# Patient Record
Sex: Male | Born: 1945 | Race: White | Hispanic: No | Marital: Married | Smoking: Never smoker
Health system: Southern US, Community
[De-identification: ages and names within clinical notes are randomized; demographics above are authoritative.]

## PROBLEM LIST (undated history)

## (undated) DIAGNOSIS — E786 Lipoprotein deficiency: Secondary | ICD-10-CM

## (undated) DIAGNOSIS — R002 Palpitations: Secondary | ICD-10-CM

## (undated) DIAGNOSIS — F419 Anxiety disorder, unspecified: Secondary | ICD-10-CM

## (undated) DIAGNOSIS — G43909 Migraine, unspecified, not intractable, without status migrainosus: Secondary | ICD-10-CM

## (undated) DIAGNOSIS — C61 Malignant neoplasm of prostate: Secondary | ICD-10-CM

## (undated) DIAGNOSIS — C4491 Basal cell carcinoma of skin, unspecified: Secondary | ICD-10-CM

## (undated) DIAGNOSIS — K635 Polyp of colon: Secondary | ICD-10-CM

## (undated) DIAGNOSIS — K219 Gastro-esophageal reflux disease without esophagitis: Secondary | ICD-10-CM

## (undated) DIAGNOSIS — H409 Unspecified glaucoma: Secondary | ICD-10-CM

## (undated) HISTORY — DX: Polyp of colon: K63.5

## (undated) HISTORY — DX: Lipoprotein deficiency: E78.6

## (undated) HISTORY — DX: Malignant neoplasm of prostate: C61

## (undated) HISTORY — DX: Basal cell carcinoma of skin, unspecified: C44.91

## (undated) HISTORY — DX: Gastro-esophageal reflux disease without esophagitis: K21.9

## (undated) HISTORY — PX: SHOULDER SURGERY: SHX246

## (undated) HISTORY — DX: Unspecified glaucoma: H40.9

## (undated) HISTORY — PX: PROSTATECTOMY: SHX69

## (undated) HISTORY — PX: KNEE SURGERY: SHX244

## (undated) HISTORY — DX: Anxiety disorder, unspecified: F41.9

## (undated) HISTORY — DX: Migraine, unspecified, not intractable, without status migrainosus: G43.909

## (undated) HISTORY — DX: Palpitations: R00.2

---

## 2009-02-16 ENCOUNTER — Inpatient Hospital Stay (HOSPITAL_COMMUNITY): Admission: RE | Admit: 2009-02-16 | Discharge: 2009-02-17 | Payer: Self-pay | Admitting: Urology

## 2009-02-16 ENCOUNTER — Encounter (INDEPENDENT_AMBULATORY_CARE_PROVIDER_SITE_OTHER): Payer: Self-pay | Admitting: Urology

## 2010-06-08 LAB — BASIC METABOLIC PANEL
CO2: 31 mEq/L (ref 19–32)
Calcium: 9.5 mg/dL (ref 8.4–10.5)
Creatinine, Ser: 0.99 mg/dL (ref 0.4–1.5)
GFR calc Af Amer: >60 mL/min (ref >60)
Glucose, Bld: 94 mg/dL (ref 70–99)

## 2010-06-08 LAB — CBC
MCHC: 33.5 g/dL (ref 30.0–36.0)
Platelets: 298 10*3/uL (ref 150–400)
RBC: 5.13 MIL/uL (ref 4.22–5.81)
RDW: 13 % (ref 11.5–15.5)

## 2010-06-08 LAB — HEMOGLOBIN AND HEMATOCRIT, BLOOD: Hemoglobin: 11.5 g/dL — ABNORMAL LOW (ref 13.0–17.0)

## 2010-06-08 LAB — TYPE AND SCREEN
ABO/RH(D): O POS
Antibody Screen: NEGATIVE

## 2011-11-30 ENCOUNTER — Other Ambulatory Visit: Payer: Self-pay | Admitting: Family Medicine

## 2011-11-30 DIAGNOSIS — R109 Unspecified abdominal pain: Secondary | ICD-10-CM

## 2011-12-05 ENCOUNTER — Ambulatory Visit
Admission: RE | Admit: 2011-12-05 | Discharge: 2011-12-05 | Disposition: A | Discharge status: Home / Self Care | Payer: Managed Care, Other (non HMO) | Source: Ambulatory Visit | Attending: Family Medicine | Admitting: Family Medicine

## 2011-12-05 DIAGNOSIS — R109 Unspecified abdominal pain: Secondary | ICD-10-CM

## 2011-12-05 MED ORDER — IOHEXOL 300 MG/ML  SOLN
100.0000 mL | Freq: Once | INTRAMUSCULAR | Status: AC | PRN
  Administered 2011-12-05: 100 mL via INTRAVENOUS

## 2013-09-22 IMAGING — CT CT ABD-PELV W/ CM
2 of 5 series · 17 of 46 positions shown, 19 images · IV contrast (READICAT/WATER & [ID] OMNI 300)
Comparison: None

CLINICAL DATA: 65 year old male with abdominal and pelvic pain.
History of prostate cancer.

CT ABDOMEN AND PELVIS WITH CONTRAST
TECHNIQUE: Multidetector CT imaging of the abdomen and pelvis was
performed following the standard protocol during bolus
administration of intravenous contrast.
Contrast: 100mL OMNIPAQUE IOHEXOL 300 MG/ML  SOLN

[Series 2: abd/pelvis with · axial · 0.77mm/px · z∈[-410,+45]mm · 14 of 99 slices shown, 16 images]
[im 5/99  soft-tissue]
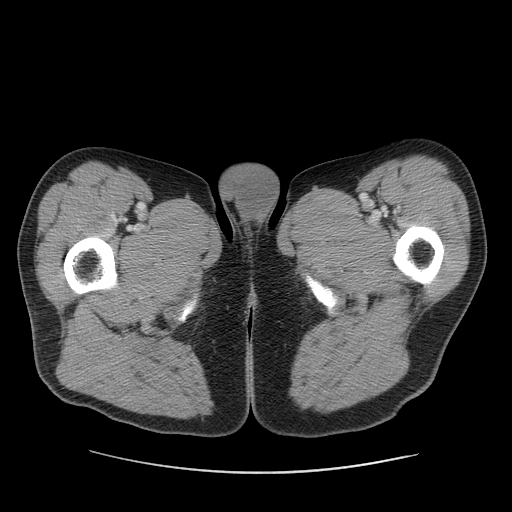
[im 5/99  bone]
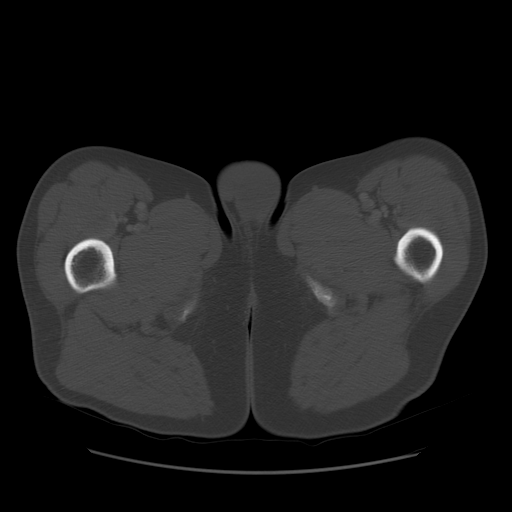
[im 15/99  soft-tissue]
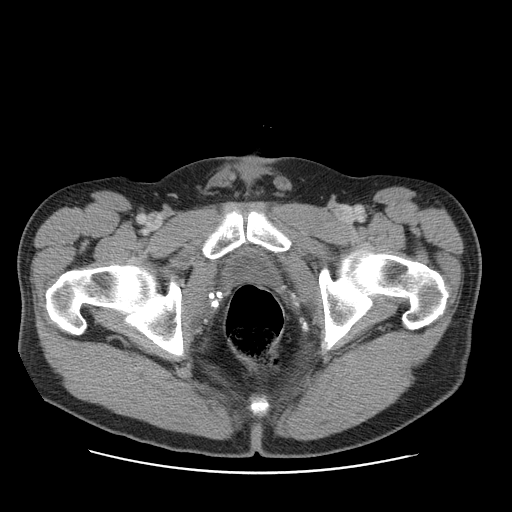
[im 20/99  soft-tissue]
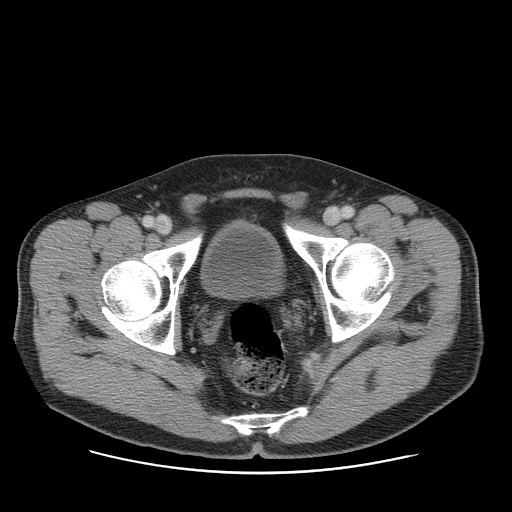
[im 25/99  soft-tissue]
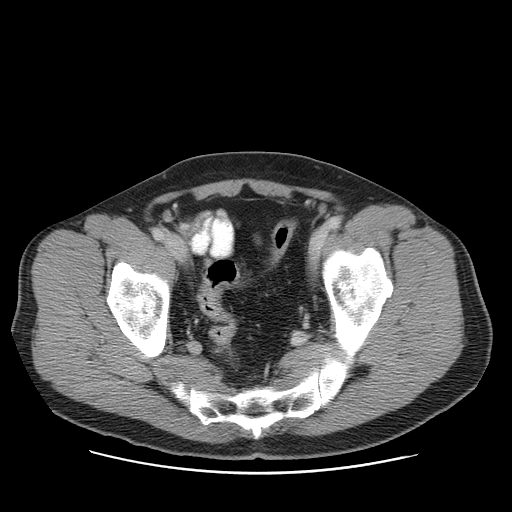
[im 35/99  soft-tissue]
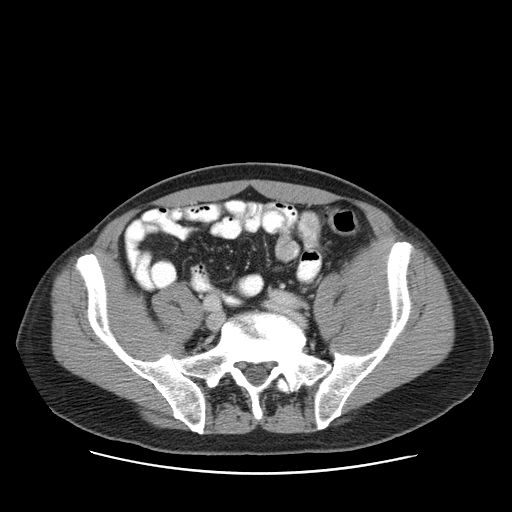
[im 40/99  soft-tissue]
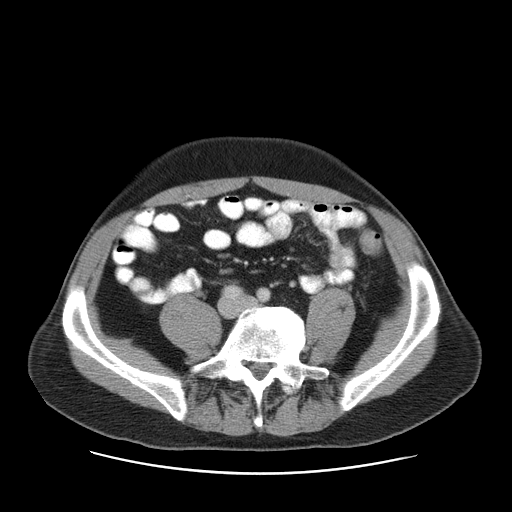
[im 45/99  soft-tissue]
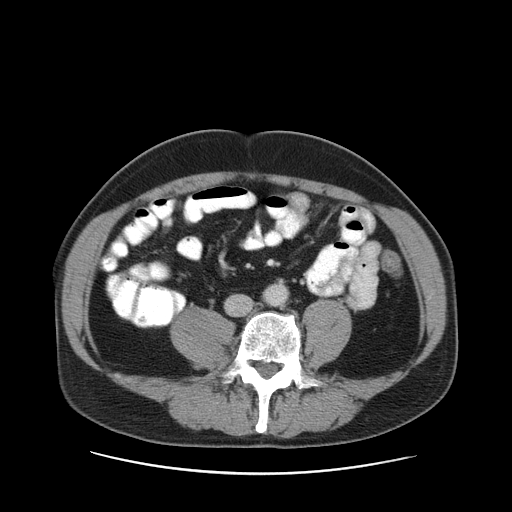
[im 54/99  soft-tissue]
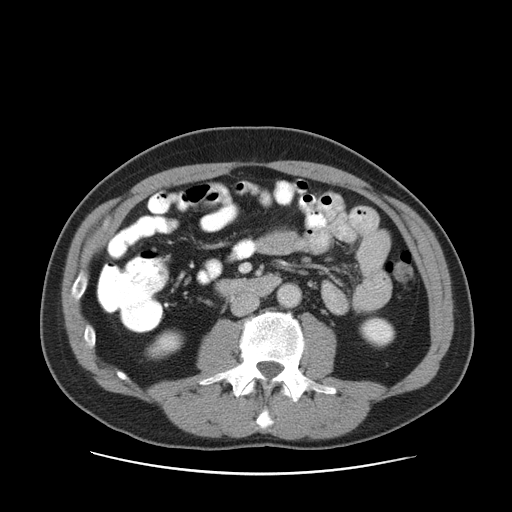
[im 59/99  soft-tissue]
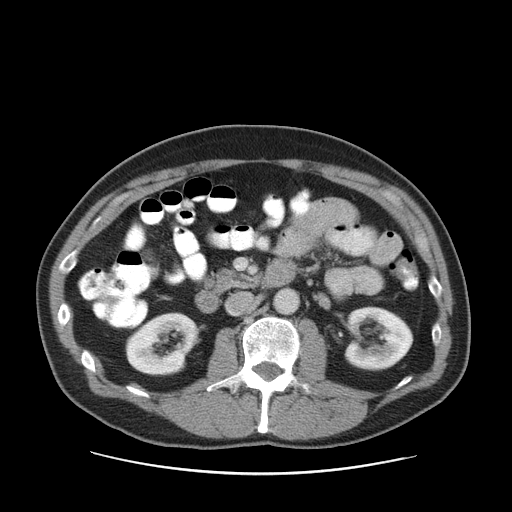
[im 59/99  bone]
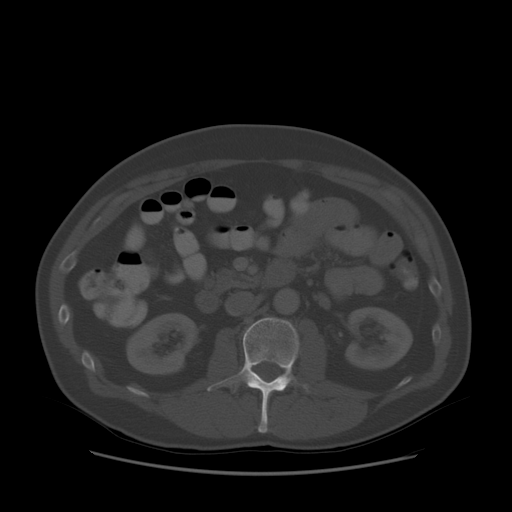
[im 64/99  soft-tissue]
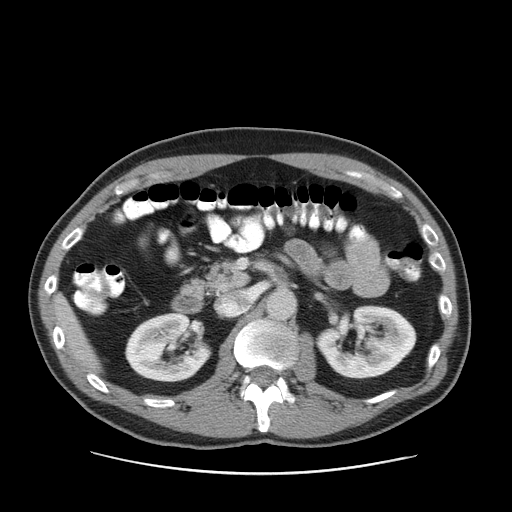
[im 74/99  soft-tissue]
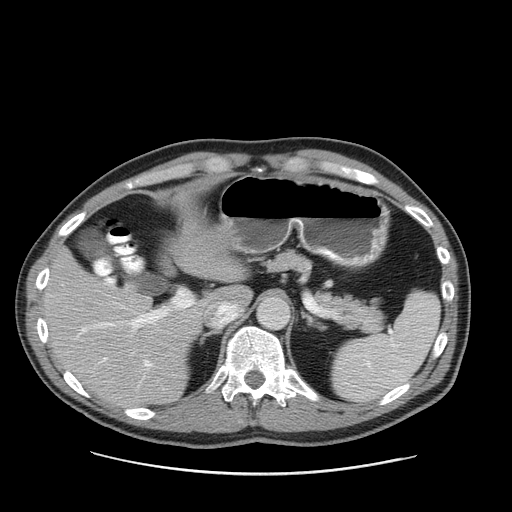
[im 79/99  soft-tissue]
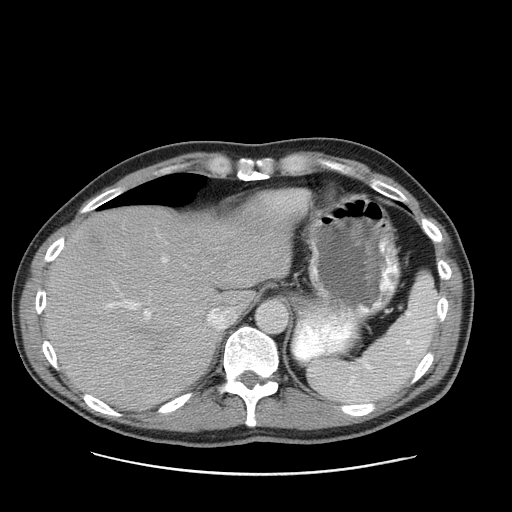
[im 84/99  soft-tissue]
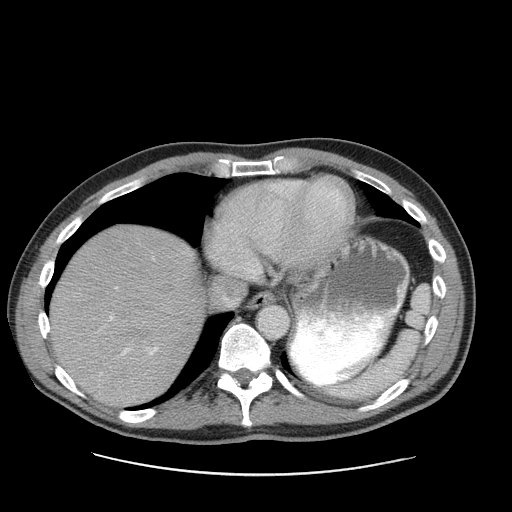
[im 94/99  soft-tissue]
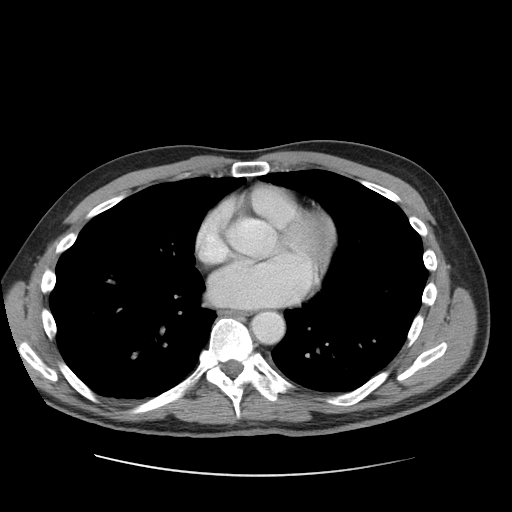

[Series 400: coronals · coronal · 1.03mm/px · 3 of 115 slices shown]
[im 39/115  soft-tissue]
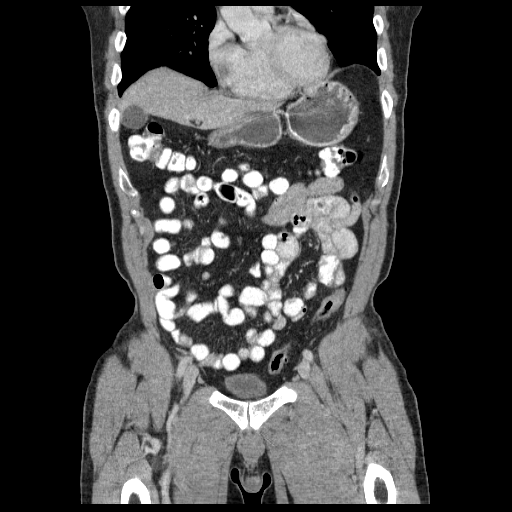
[im 51/115  soft-tissue]
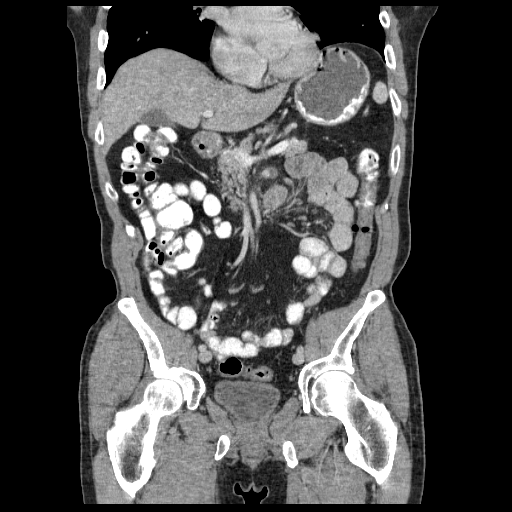
[im 64/115  soft-tissue]
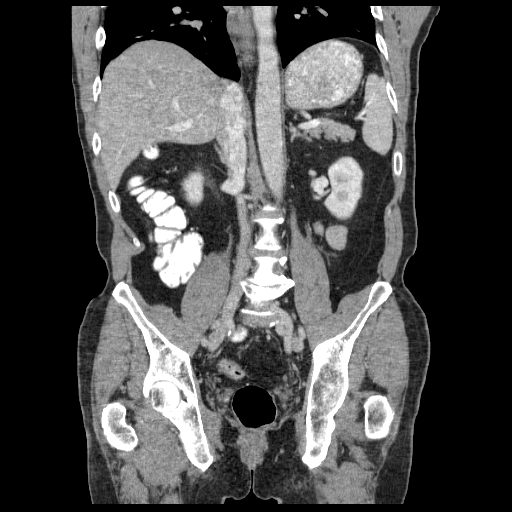

[17 of 46 positions shown; findings below may reference images not displayed]

FINDINGS: The liver, spleen, pancreas, adrenal glands, kidneys and
gallbladder unremarkable.

No free fluid, enlarged lymph nodes, biliary dilation or abdominal
aortic aneurysm identified.

The bowel and bladder are unremarkable.

The patient is status post prostatectomy.
There is no evidence of discrete mass or enlarged lymph nodes
within the pelvis.

No acute or suspicious bony abnormalities are identified.
A moderate apex left lumbar scoliosis is again identified with
moderate to severe degenerative disc disease at L4-L5 and L5-S1.
IMPRESSION: No evidence of acute abnormality or metastatic disease.

## 2015-10-15 ENCOUNTER — Telehealth: Payer: Self-pay | Admitting: Internal Medicine

## 2015-10-15 NOTE — Telephone Encounter (Signed)
Received records from [place] for appointment on 11/03/15 with Dr Debara Pickett.  Records given to [place] (medical records) for Dr Lysbeth Penner schedule on 11/03/15. lp

## 2015-11-03 ENCOUNTER — Ambulatory Visit: Payer: Managed Care, Other (non HMO) | Admitting: Internal Medicine

## 2015-12-07 ENCOUNTER — Ambulatory Visit: Payer: Managed Care, Other (non HMO) | Admitting: Internal Medicine

## 2015-12-11 ENCOUNTER — Encounter (INDEPENDENT_AMBULATORY_CARE_PROVIDER_SITE_OTHER): Payer: Managed Care, Other (non HMO)

## 2015-12-11 ENCOUNTER — Encounter: Payer: Self-pay | Admitting: Internal Medicine

## 2015-12-11 ENCOUNTER — Ambulatory Visit (INDEPENDENT_AMBULATORY_CARE_PROVIDER_SITE_OTHER): Payer: Managed Care, Other (non HMO) | Admitting: Internal Medicine

## 2015-12-11 VITALS — BP 142/85 | HR 59 | Ht 70.0 in | Wt 165.4 lb

## 2015-12-11 DIAGNOSIS — R002 Palpitations: Secondary | ICD-10-CM | POA: Diagnosis not present

## 2015-12-11 NOTE — Patient Instructions (Signed)
Medication Instructions:  Continue current medications  Labwork: None Ordered  Testing/Procedures: Your physician has recommended that you wear an event monitor for 30 days. Event monitors are medical devices that record the heart's electrical activity. Doctors most often [place] these monitors to diagnose arrhythmias. Arrhythmias are problems with the speed or rhythm of the heartbeat. The monitor is a small, portable device. You can wear one while you do your normal daily activities. This is usually used to diagnose what is causing palpitations/syncope (passing out).  Follow-Up: Your physician recommends that you schedule a follow-up appointment in: 6-8 weeks   Any Other Special Instructions Will Be Listed Below (If Applicable).   If you need a refill on your cardiac medications before your next appointment, please call your pharmacy.

## 2015-12-11 NOTE — Progress Notes (Signed)
OFFICE NOTE  Chief Complaint:  Heart fluttering  Primary Care Physician: Vernon Pac, MD  HPI:  Vernon Lawson is a 70 y.o. male he is currently referred to me by Dr. Rex Lawson for evaluation of heart fluttering. Vernon Lawson reports she's had this on and off for "longer than I can remember". Recently he had an episode of sustained palpitations mostly at night. He had difficulty sleeping. Since then he's only had 2 other episodes which were short-lived. He reports a small amount of caffeine use with mostly sodas, consisting of 1 per day several days a week. He also regularly uses an aspirin/caffeine/acetaminophen (Excedrin) for headaches which she gets regularly in the morning. He reports good sleep and energy during the day and has been told he snores lightly but denies any witnessed apnea. He also denies any chest pain or worsening shortness of breath. He is retired but maintains some physical activity. There is no significant coronary artery disease history in his family. Interestingly, his son is a PA who works in cardiology with [employer].  PMHx:  Past Medical History:  Diagnosis Date  . Anxiety   . Basal cell carcinoma   . Colon polyps   . GERD (gastroesophageal reflux disease)   . Glaucoma   . Low HDL (under 40)   . Migraines   . Palpitations   . Prostate cancer [facility])     Past Surgical History:  Procedure Laterality Date  . KNEE SURGERY Left   . PROSTATECTOMY    . SHOULDER SURGERY      FAMHx:  Family History  Problem Relation Age of Onset  . Breast cancer Mother   . Aneurysm Father     SOCHx:   reports that he has never smoked. He does not have any smokeless tobacco history on file. His alcohol and drug histories are not on file.  ALLERGIES:  No Known Allergies  ROS: Pertinent items noted in HPI and remainder of comprehensive ROS otherwise negative.  HOME MEDS: Current Outpatient Prescriptions on File Prior to Visit  Medication Sig Dispense Refill  .  aspirin-acetaminophen-caffeine (EXCEDRIN MIGRAINE) 250-250-65 MG tablet Take 1 tablet by mouth every 6 (six) hours as needed for headache.    . clonazePAM (KLONOPIN) 1 MG tablet Take 1 mg by mouth daily.    . famotidine (PEPCID) 10 MG tablet Take 10 mg by mouth 2 (two) times daily.    Marland Kitchen latanoprost (XALATAN) 0.005 % ophthalmic solution 1 drop at bedtime.    . Multiple Vitamin (MULTIVITAMIN WITH MINERALS) TABS tablet Take 1 tablet by mouth daily.    . rizatriptan (MAXALT) 10 MG tablet Take 10 mg by mouth as needed for migraine. May repeat in 2 hours if needed    . timolol (BETIMOL) 0.25 % ophthalmic solution 1-2 drops daily.     No current facility-administered medications on file prior to visit.     LABS/IMAGING: No results found for this or any previous visit (from the past 48 hour(s)). No results found.  WEIGHTS: Wt Readings from Last 3 Encounters:  12/11/15 165 lb 6.4 oz (75 kg)    VITALS: BP (!) 142/85   Pulse (!) 59   Ht 5\' 10"  (1.778 m)   Wt 165 lb 6.4 oz (75 kg)   BMI 23.73 kg/m   EXAM: General appearance: alert and no distress Neck: no carotid bruit and no JVD Lungs: clear to auscultation bilaterally Heart: regular rate and rhythm, S1, S2 normal, no murmur, click, rub or gallop Abdomen: soft,  non-tender; bowel sounds normal; no masses,  no organomegaly Extremities: extremities normal, atraumatic, no cyanosis or edema Pulses: 2+ and symmetric Skin: Skin color, texture, turgor normal. No rashes or lesions Neurologic: Grossly normal Psych: Pleasant  EKG: Sinus bradycardia 59  ASSESSMENT: 1. Palpitations  PLAN: 1.   Vernon Lawson is experiencing palpitations. This has been present for "longer than he can remember". Recently, there has been an increase in severity and duration, but then it has waned. I recommended a 30 day monitor to see if we can pick up on the cause of his palpitations. I suspect these are benign. He does not report any warning symptoms such as chest  pain or worsening shortness of breath. His cardiovascular exam was benign. EKG at rest is normal. Although he has morning headaches, I'm not overwhelmingly convinced this is related to sleep apnea although that could be a cause of palpitations. He could also have NSAID related rebound headaches with frequent use of Excedrin. I will defer to his PCP on this.  Follow-up with me in 4-6 weeks after monitoring.  Thanks as always for the kind referral.  Vernon Casino, MD, [facility] Attending Cardiologist [place] 12/11/2015, 12:37 PM

## 2016-01-04 ENCOUNTER — Telehealth: Payer: Self-pay | Admitting: Internal Medicine

## 2016-01-04 NOTE — Telephone Encounter (Signed)
New message      Pt is wearing an event monitor.  He had it put on 12-11-15.  He states he will be out of town in the [place] when it is time to take it off.  Can he take monitor off within the next day or two and send it back to the company?  [place]  Please call

## 2016-01-04 NOTE — Telephone Encounter (Signed)
Returned call. Pt aware preference is to wear for full prescribed time, but he notes this will be 27-28 days of wear by the day he leaves. Advised would be OK to return, it's really his choice. Recommended use of [employer] drop station for return of package and reminded him of results follow up time. Pt voiced thanks for call.

## 2016-01-10 DIAGNOSIS — R002 Palpitations: Secondary | ICD-10-CM | POA: Diagnosis not present

## 2016-01-27 ENCOUNTER — Encounter: Payer: Self-pay | Admitting: *Deleted

## 2016-02-01 ENCOUNTER — Telehealth: Payer: Self-pay | Admitting: Internal Medicine

## 2016-02-01 NOTE — Telephone Encounter (Signed)
Closed encounter °

## 2016-02-02 ENCOUNTER — Ambulatory Visit: Payer: Managed Care, Other (non HMO) | Admitting: Internal Medicine

## 2016-02-18 ENCOUNTER — Encounter: Payer: Self-pay | Admitting: Internal Medicine

## 2016-02-18 ENCOUNTER — Ambulatory Visit (INDEPENDENT_AMBULATORY_CARE_PROVIDER_SITE_OTHER): Payer: Managed Care, Other (non HMO) | Admitting: Internal Medicine

## 2016-02-18 VITALS — BP 128/80 | HR 74 | Ht 70.0 in | Wt 171.4 lb

## 2016-02-18 DIAGNOSIS — R002 Palpitations: Secondary | ICD-10-CM

## 2016-02-18 NOTE — Patient Instructions (Signed)
Your physician recommends that you schedule a follow-up appointment as needed  

## 2016-02-18 NOTE — Progress Notes (Signed)
OFFICE NOTE  Chief Complaint:  Heart fluttering  Primary Care Physician: Gennette Pac, MD  HPI:  Vernon Lawson is a 70 y.o. male he is currently referred to me by Dr. Rex Kras for evaluation of heart fluttering. Vernon Lawson reports she's had this on and off for "longer than I can remember". Recently he had an episode of sustained palpitations mostly at night. He had difficulty sleeping. Since then he's only had 2 other episodes which were short-lived. He reports a small amount of caffeine use with mostly sodas, consisting of 1 per day several days a week. He also regularly uses an aspirin/caffeine/acetaminophen (Excedrin) for headaches which she gets regularly in the morning. He reports good sleep and energy during the day and has been told he snores lightly but denies any witnessed apnea. He also denies any chest pain or worsening shortness of breath. He is retired but maintains some physical activity. There is no significant coronary artery disease history in his family. Interestingly, his son is a PA who works in cardiology with [employer].  02/18/2016  Vernon Lawson was seen today in follow-up. He wore monitor for one month for palpitations. He reports he did have some palpitations while wearing the monitor, but we failed to pick up on any abnormalities. He did have some. To sinus tachycardia and bradycardia (mostly at night). He reports she's not very symptomatic with these episodes. He continues to have headaches which may be associated with these episodes.  PMHx:  Past Medical History:  Diagnosis Date  . Anxiety   . Basal cell carcinoma   . Colon polyps   . GERD (gastroesophageal reflux disease)   . Glaucoma   . Low HDL (under 40)   . Migraines   . Palpitations   . Prostate cancer [facility])     Past Surgical History:  Procedure Laterality Date  . KNEE SURGERY Left   . PROSTATECTOMY    . SHOULDER SURGERY      FAMHx:  Family History  Problem Relation Age of Onset  . Breast cancer  Mother   . Aneurysm Father     SOCHx:   reports that he has never smoked. He has never used smokeless tobacco. His alcohol and drug histories are not on file.  ALLERGIES:  No Known Allergies  ROS: Pertinent items noted in HPI and remainder of comprehensive ROS otherwise negative.  HOME MEDS: Current Outpatient Prescriptions on File Prior to Visit  Medication Sig Dispense Refill  . aspirin-acetaminophen-caffeine (EXCEDRIN MIGRAINE) 250-250-65 MG tablet Take 1 tablet by mouth every 6 (six) hours as needed for headache.    . clonazePAM (KLONOPIN) 1 MG tablet Take 1 mg by mouth daily.    . famotidine (PEPCID) 10 MG tablet Take 10 mg by mouth 2 (two) times daily.    Marland Kitchen latanoprost (XALATAN) 0.005 % ophthalmic solution 1 drop at bedtime.    . Multiple Vitamin (MULTIVITAMIN WITH MINERALS) TABS tablet Take 1 tablet by mouth daily.    . rizatriptan (MAXALT) 10 MG tablet Take 10 mg by mouth as needed for migraine. May repeat in 2 hours if needed    . timolol (BETIMOL) 0.25 % ophthalmic solution 1-2 drops daily.     No current facility-administered medications on file prior to visit.     LABS/IMAGING: No results found for this or any previous visit (from the past 48 hour(s)). No results found.  WEIGHTS: Wt Readings from Last 3 Encounters:  12/11/15 165 lb 6.4 oz (75 kg)  VITALS: There were no vitals taken for this visit.  EXAM: Deferred  EKG: Deferred  ASSESSMENT: 1. Palpitations  PLAN: 1.   Mr. [name] wore a 30 day monitor which showed sinus bradycardia, mostly nocturnal with low heart rate of 43 and some periods of sinus tachycardia. No significant arrhythmias were noted. There was no evidence of any extrasystoles. He continues to have headaches. I wonder if he may be having some fasciculations in the chest wall which may be interpreted as palpitations. He does not seem overly bothered by this. I recommended continuing monitoring for increased frequency of his symptoms and  would be happy to see him back if they become worse or he is interested in treatment. I advised he may consider seen headache specialist for treatment of his headaches.  Follow-up when necessary.  Pixie Casino, MD, [facility] Attending Cardiologist [place] Makaiyah Schweiger 02/18/2016, 8:26 AM

## 2020-04-29 DIAGNOSIS — K219 Gastro-esophageal reflux disease without esophagitis: Secondary | ICD-10-CM | POA: Diagnosis not present

## 2020-04-29 DIAGNOSIS — E782 Mixed hyperlipidemia: Secondary | ICD-10-CM | POA: Diagnosis not present

## 2020-04-29 DIAGNOSIS — H4089 Other specified glaucoma: Secondary | ICD-10-CM | POA: Diagnosis not present

## 2020-04-29 DIAGNOSIS — E7800 Pure hypercholesterolemia, unspecified: Secondary | ICD-10-CM | POA: Diagnosis not present

## 2020-05-26 DIAGNOSIS — H401132 Primary open-angle glaucoma, bilateral, moderate stage: Secondary | ICD-10-CM | POA: Diagnosis not present

## 2022-07-26 ENCOUNTER — Other Ambulatory Visit (HOSPITAL_COMMUNITY): Payer: Self-pay | Admitting: Family Medicine

## 2022-07-26 DIAGNOSIS — E7800 Pure hypercholesterolemia, unspecified: Secondary | ICD-10-CM

## 2022-07-26 DIAGNOSIS — H401132 Primary open-angle glaucoma, bilateral, moderate stage: Secondary | ICD-10-CM | POA: Diagnosis not present

## 2022-08-16 ENCOUNTER — Ambulatory Visit (HOSPITAL_COMMUNITY)
Admission: RE | Admit: 2022-08-16 | Discharge: 2022-08-16 | Disposition: A | Discharge status: Home / Self Care | Payer: Managed Care, Other (non HMO) | Source: Ambulatory Visit | Attending: Family Medicine | Admitting: Family Medicine

## 2022-08-16 DIAGNOSIS — E7800 Pure hypercholesterolemia, unspecified: Secondary | ICD-10-CM | POA: Insufficient documentation

## 2022-09-12 DIAGNOSIS — R7303 Prediabetes: Secondary | ICD-10-CM | POA: Diagnosis not present

## 2022-09-12 DIAGNOSIS — E7800 Pure hypercholesterolemia, unspecified: Secondary | ICD-10-CM | POA: Diagnosis not present

## 2022-09-13 DIAGNOSIS — Z0000 Encounter for general adult medical examination without abnormal findings: Secondary | ICD-10-CM | POA: Diagnosis not present

## 2022-09-13 DIAGNOSIS — E7800 Pure hypercholesterolemia, unspecified: Secondary | ICD-10-CM | POA: Diagnosis not present

## 2022-09-13 DIAGNOSIS — K219 Gastro-esophageal reflux disease without esophagitis: Secondary | ICD-10-CM | POA: Diagnosis not present

## 2022-09-13 DIAGNOSIS — G43909 Migraine, unspecified, not intractable, without status migrainosus: Secondary | ICD-10-CM | POA: Diagnosis not present

## 2022-09-13 DIAGNOSIS — R030 Elevated blood-pressure reading, without diagnosis of hypertension: Secondary | ICD-10-CM | POA: Diagnosis not present

## 2022-09-13 DIAGNOSIS — R7303 Prediabetes: Secondary | ICD-10-CM | POA: Diagnosis not present

## 2022-09-13 DIAGNOSIS — H6120 Impacted cerumen, unspecified ear: Secondary | ICD-10-CM | POA: Diagnosis not present

## 2022-09-13 DIAGNOSIS — G4700 Insomnia, unspecified: Secondary | ICD-10-CM | POA: Diagnosis not present

## 2022-11-10 DIAGNOSIS — K219 Gastro-esophageal reflux disease without esophagitis: Secondary | ICD-10-CM | POA: Diagnosis not present

## 2022-12-19 DIAGNOSIS — Z860101 Personal history of adenomatous and serrated colon polyps: Secondary | ICD-10-CM | POA: Diagnosis not present

## 2022-12-19 DIAGNOSIS — Z09 Encounter for follow-up examination after completed treatment for conditions other than malignant neoplasm: Secondary | ICD-10-CM | POA: Diagnosis not present

## 2023-02-09 DIAGNOSIS — D2271 Melanocytic nevi of right lower limb, including hip: Secondary | ICD-10-CM | POA: Diagnosis not present

## 2023-02-09 DIAGNOSIS — L905 Scar conditions and fibrosis of skin: Secondary | ICD-10-CM | POA: Diagnosis not present

## 2023-02-09 DIAGNOSIS — L570 Actinic keratosis: Secondary | ICD-10-CM | POA: Diagnosis not present

## 2023-02-09 DIAGNOSIS — Z85828 Personal history of other malignant neoplasm of skin: Secondary | ICD-10-CM | POA: Diagnosis not present

## 2023-02-09 DIAGNOSIS — D225 Melanocytic nevi of trunk: Secondary | ICD-10-CM | POA: Diagnosis not present

## 2023-02-09 DIAGNOSIS — L821 Other seborrheic keratosis: Secondary | ICD-10-CM | POA: Diagnosis not present

## 2023-02-09 DIAGNOSIS — L814 Other melanin hyperpigmentation: Secondary | ICD-10-CM | POA: Diagnosis not present

## 2023-02-14 DIAGNOSIS — H401132 Primary open-angle glaucoma, bilateral, moderate stage: Secondary | ICD-10-CM | POA: Diagnosis not present

## 2023-08-05 DIAGNOSIS — E7800 Pure hypercholesterolemia, unspecified: Secondary | ICD-10-CM | POA: Diagnosis not present

## 2023-08-21 DIAGNOSIS — H401132 Primary open-angle glaucoma, bilateral, moderate stage: Secondary | ICD-10-CM | POA: Diagnosis not present

## 2023-09-14 DIAGNOSIS — E7800 Pure hypercholesterolemia, unspecified: Secondary | ICD-10-CM | POA: Diagnosis not present

## 2023-09-14 DIAGNOSIS — R7303 Prediabetes: Secondary | ICD-10-CM | POA: Diagnosis not present

## 2023-09-14 DIAGNOSIS — Z08 Encounter for follow-up examination after completed treatment for malignant neoplasm: Secondary | ICD-10-CM | POA: Diagnosis not present

## 2023-09-18 DIAGNOSIS — H6122 Impacted cerumen, left ear: Secondary | ICD-10-CM | POA: Diagnosis not present

## 2023-09-18 DIAGNOSIS — R7303 Prediabetes: Secondary | ICD-10-CM | POA: Diagnosis not present

## 2023-09-18 DIAGNOSIS — Z0000 Encounter for general adult medical examination without abnormal findings: Secondary | ICD-10-CM | POA: Diagnosis not present

## 2023-09-18 DIAGNOSIS — K219 Gastro-esophageal reflux disease without esophagitis: Secondary | ICD-10-CM | POA: Diagnosis not present

## 2023-09-18 DIAGNOSIS — G43909 Migraine, unspecified, not intractable, without status migrainosus: Secondary | ICD-10-CM | POA: Diagnosis not present

## 2023-09-18 DIAGNOSIS — E7800 Pure hypercholesterolemia, unspecified: Secondary | ICD-10-CM | POA: Diagnosis not present

## 2023-09-18 DIAGNOSIS — G4700 Insomnia, unspecified: Secondary | ICD-10-CM | POA: Diagnosis not present
